# Patient Record
Sex: Female | Born: 1980 | Race: White | Hispanic: No | Marital: Married | Smoking: Former smoker
Health system: Southern US, Community
[De-identification: ages and names within clinical notes are randomized; demographics above are authoritative.]

## PROBLEM LIST (undated history)

## (undated) DIAGNOSIS — IMO0002 Reserved for concepts with insufficient information to code with codable children: Secondary | ICD-10-CM

## (undated) HISTORY — PX: WISDOM TOOTH EXTRACTION: SHX21

## (undated) HISTORY — DX: Reserved for concepts with insufficient information to code with codable children: IMO0002

## (undated) HISTORY — PX: TONSILLECTOMY: SUR1361

---

## 2001-10-03 ENCOUNTER — Encounter: Admission: RE | Admit: 2001-10-03 | Discharge: 2001-10-03 | Payer: Self-pay | Admitting: Family Medicine

## 2001-10-03 ENCOUNTER — Encounter: Payer: Self-pay | Admitting: Family Medicine

## 2004-01-13 HISTORY — PX: LEEP: SHX91

## 2004-02-20 ENCOUNTER — Ambulatory Visit (HOSPITAL_COMMUNITY): Admission: RE | Admit: 2004-02-20 | Discharge: 2004-02-20 | Payer: Self-pay | Admitting: Obstetrics and Gynecology

## 2004-02-20 ENCOUNTER — Encounter (INDEPENDENT_AMBULATORY_CARE_PROVIDER_SITE_OTHER): Payer: Self-pay | Admitting: *Deleted

## 2004-09-29 ENCOUNTER — Encounter: Admission: RE | Admit: 2004-09-29 | Discharge: 2004-09-29 | Payer: Self-pay | Admitting: Family Medicine

## 2004-10-31 ENCOUNTER — Encounter: Admission: RE | Admit: 2004-10-31 | Discharge: 2004-10-31 | Payer: Self-pay | Admitting: Family Medicine

## 2005-03-16 ENCOUNTER — Inpatient Hospital Stay (HOSPITAL_COMMUNITY): Admission: AD | Admit: 2005-03-16 | Discharge: 2005-03-17 | Payer: Self-pay | Admitting: *Deleted

## 2005-04-01 ENCOUNTER — Other Ambulatory Visit: Admission: RE | Admit: 2005-04-01 | Discharge: 2005-04-01 | Payer: Self-pay | Admitting: Obstetrics and Gynecology

## 2005-08-21 ENCOUNTER — Ambulatory Visit (HOSPITAL_COMMUNITY): Admission: AD | Admit: 2005-08-21 | Discharge: 2005-08-21 | Payer: Self-pay | Admitting: Obstetrics and Gynecology

## 2005-09-12 ENCOUNTER — Inpatient Hospital Stay (HOSPITAL_COMMUNITY): Admission: AD | Admit: 2005-09-12 | Discharge: 2005-09-12 | Payer: Self-pay | Admitting: Obstetrics & Gynecology

## 2005-11-08 ENCOUNTER — Inpatient Hospital Stay (HOSPITAL_COMMUNITY): Admission: AD | Admit: 2005-11-08 | Discharge: 2005-11-11 | Payer: Self-pay | Admitting: Obstetrics and Gynecology

## 2008-02-15 ENCOUNTER — Emergency Department (HOSPITAL_BASED_OUTPATIENT_CLINIC_OR_DEPARTMENT_OTHER): Admission: EM | Admit: 2008-02-15 | Discharge: 2008-02-16 | Payer: Self-pay | Admitting: Emergency Medicine

## 2008-02-15 ENCOUNTER — Ambulatory Visit: Payer: Self-pay | Admitting: Diagnostic Radiology

## 2010-01-12 NOTE — L&D Delivery Note (Signed)
Delivery Note At 8:39 AM a viable and healthy female was delivered vaginally, vacuum assist (bradycardia noted at +3 station).  Presentation: Left Occiput Anterior.  APGAR: 9, 9; weight 8 lb 5 oz (3771 g).   Placenta status: Intact, Spontaneous.  Cord blood donation.  Cord: 3 vessels.  Anesthesia: Epidural  Episiotomy: None Lacerations: 2nd degree Suture Repair: 3.0 chromic Est. Blood Loss (mL): 450  Mom to postpartum.  Baby to nursery-stable.  Javen Ridings D 12/29/2010, 9:32 AM

## 2010-05-13 LAB — GC/CHLAMYDIA PROBE AMP, GENITAL
Chlamydia: NEGATIVE
Gonorrhea: NEGATIVE

## 2010-05-13 LAB — HIV ANTIBODY (ROUTINE TESTING W REFLEX): HIV: NONREACTIVE

## 2010-05-13 LAB — ABO/RH: RH Type: NEGATIVE

## 2010-05-13 LAB — RUBELLA ANTIBODY, IGM: Rubella: IMMUNE

## 2010-05-13 LAB — ANTIBODY SCREEN: Antibody Screen: NEGATIVE

## 2010-05-13 LAB — RPR: RPR: NONREACTIVE

## 2010-05-30 NOTE — Op Note (Signed)
NAME:  Paige Nielsen, Paige Nielsen             ACCOUNT NO.:  [id number]   MEDICAL RECORD NO.:  [id number]          PATIENT TYPE:  AMB   LOCATION:  SDC                           FACILITY:  [facility]   PHYSICIAN:  Maxie Better, M.D.DATE OF BIRTH:  November 04, 1980   DATE OF PROCEDURE:  02/20/2004  DATE OF DISCHARGE:                                 OPERATIVE REPORT   PREOPERATIVE DIAGNOSIS:  Severe vulvar dysplasia, moderate cervical  dysplasia.   POSTOPERATIVE DIAGNOSIS:  VIN-III and CIN-II.   OPERATION PERFORMED:  CO2 laser vaporization of the cervix and vulvar  dysplasia, LEEP procedure of the cervix.   SURGEON:  Maxie Better, M.D.   ANESTHESIA:  General.   DESCRIPTION OF PROCEDURE:  Under adequate general anesthesia, the patient  was placed in dorsal lithotomy position.  Acetic acid 4% soaked gauze was  placed in the posterior fourchette area.  A bivalve speculum was placed in  the vagina.  The surrounding vulvar area was prepped with soaked wet towels.  4% acetic acid was placed on the cervix.  Colposcopy was performed again  revealing the acetowhite epithelium predominantly in the anterior lip and  some coarse mosaic patterns at 2 o'clock and an isolated Michaelfurt of  acetowhite tissue on the posterior lip as well.  The cervix was infiltrated  with a 0.5% Marcaine with epinephrine.  The outlined area of dysplasia was  identified using the laser continuous wave spot check around  circumferentially.  A 10 mm loupe was then utilized to perform a LEEP cone  taking the anterior posterior lip small portion and then the remaining  portion of the cervix containing the cervical dysplasia was then laser  vaporized in continuous waves using between 10 and 25 watts to a depth of 3  mm externally and about 6 internally.  The  outside area was then fan  brushed with 5 watts.  The base of the coned area and lasered area was then  hemostased with Monsel solution.  The bivalve speculum was then removed  and  the gauze removed from the area of the fourchette showing a large about a  quarter-sized area of whitened tissue from the 4% acetic acid on the right  lower aspect of the posterior fourchette.  That area was demarcated using a  marking pen and 25 watts were then used to carefully vaporize the vulvar  dysplastic areas.  The area was then infiltrated with local anesthesia as  well.  The procedure then was terminated.   SPECIMEN:  The LEEP anterior posterior lip.   ESTIMATED BLOOD LOSS:  Minimal.   COMPLICATIONS:  None.   The patient had Silvadene cream placed on the vulvar area prior to leaving  the operating room.  She was then transferred to the recovery room in stable  condition.      [place]/MEDQ  D:  02/20/2004  T:  02/20/2004  Job:  [id number]

## 2010-07-18 ENCOUNTER — Other Ambulatory Visit (HOSPITAL_COMMUNITY): Payer: Self-pay | Admitting: Obstetrics and Gynecology

## 2010-07-18 DIAGNOSIS — Z3689 Encounter for other specified antenatal screening: Secondary | ICD-10-CM

## 2010-07-24 ENCOUNTER — Ambulatory Visit (HOSPITAL_COMMUNITY)
Admission: RE | Admit: 2010-07-24 | Discharge: 2010-07-24 | Disposition: A | Discharge status: Home / Self Care | Payer: 59 | Source: Ambulatory Visit | Attending: Obstetrics and Gynecology | Admitting: Obstetrics and Gynecology

## 2010-07-24 DIAGNOSIS — Z3689 Encounter for other specified antenatal screening: Secondary | ICD-10-CM

## 2010-07-24 DIAGNOSIS — Z363 Encounter for antenatal screening for malformations: Secondary | ICD-10-CM | POA: Insufficient documentation

## 2010-07-24 DIAGNOSIS — Z1389 Encounter for screening for other disorder: Secondary | ICD-10-CM | POA: Insufficient documentation

## 2010-07-24 DIAGNOSIS — O358XX0 Maternal care for other (suspected) fetal abnormality and damage, not applicable or unspecified: Secondary | ICD-10-CM | POA: Insufficient documentation

## 2010-11-14 LAB — STREP B DNA PROBE: GBS: NEGATIVE

## 2010-12-24 ENCOUNTER — Encounter (HOSPITAL_COMMUNITY): Payer: Self-pay | Admitting: *Deleted

## 2010-12-24 ENCOUNTER — Telehealth (HOSPITAL_COMMUNITY): Payer: Self-pay | Admitting: *Deleted

## 2010-12-24 NOTE — Telephone Encounter (Signed)
Preadmission screen  

## 2010-12-28 ENCOUNTER — Inpatient Hospital Stay (HOSPITAL_COMMUNITY)
Admission: AD | Admit: 2010-12-28 | Discharge: 2010-12-31 | DRG: 775 | Disposition: A | Discharge status: Home / Self Care | Payer: 59 | Source: Ambulatory Visit | Attending: Obstetrics and Gynecology | Admitting: Obstetrics and Gynecology

## 2010-12-28 ENCOUNTER — Inpatient Hospital Stay (HOSPITAL_COMMUNITY): Payer: 59

## 2010-12-28 ENCOUNTER — Encounter (HOSPITAL_COMMUNITY): Payer: Self-pay

## 2010-12-28 DIAGNOSIS — O368190 Decreased fetal movements, unspecified trimester, not applicable or unspecified: Secondary | ICD-10-CM | POA: Diagnosis present

## 2010-12-28 DIAGNOSIS — Z3480 Encounter for supervision of other normal pregnancy, unspecified trimester: Secondary | ICD-10-CM

## 2010-12-28 LAB — CBC
Platelets: 205 10*3/uL (ref 150–400)
RBC: 3.81 MIL/uL — ABNORMAL LOW (ref 3.87–5.11)
WBC: 10 10*3/uL (ref 4.0–10.5)

## 2010-12-28 MED ORDER — FLEET ENEMA 7-19 GM/118ML RE ENEM: 1.0000 | ENEMA | RECTAL | Status: DC | PRN

## 2010-12-28 MED ORDER — ACETAMINOPHEN 325 MG PO TABS: 650.0000 mg | ORAL_TABLET | ORAL | Status: DC | PRN

## 2010-12-28 MED ORDER — LIDOCAINE HCL (PF) 1 % IJ SOLN
30.0000 mL | INTRAMUSCULAR | Status: DC | PRN
  Filled 2010-12-28: qty 30

## 2010-12-28 MED ORDER — LACTATED RINGERS IV SOLN
INTRAVENOUS | Status: DC
  Administered 2010-12-29 (×2): via INTRAVENOUS

## 2010-12-28 MED ORDER — OXYTOCIN BOLUS FROM INFUSION
500.0000 mL | Freq: Once | INTRAVENOUS | Status: DC
  Filled 2010-12-28: qty 500

## 2010-12-28 MED ORDER — OXYTOCIN 20 UNITS IN LACTATED RINGERS INFUSION - SIMPLE
125.0000 mL/h | Freq: Once | INTRAVENOUS | Status: AC
  Administered 2010-12-29: 125 mL/h via INTRAVENOUS

## 2010-12-28 MED ORDER — IBUPROFEN 600 MG PO TABS
600.0000 mg | ORAL_TABLET | Freq: Four times a day (QID) | ORAL | Status: DC | PRN
  Administered 2010-12-29: 600 mg via ORAL
  Filled 2010-12-28: qty 1

## 2010-12-28 MED ORDER — LACTATED RINGERS IV SOLN: 500.0000 mL | INTRAVENOUS | Status: DC | PRN

## 2010-12-28 MED ORDER — ONDANSETRON HCL 4 MG/2ML IJ SOLN
4.0000 mg | Freq: Four times a day (QID) | INTRAMUSCULAR | Status: DC | PRN
  Administered 2010-12-29: 4 mg via INTRAVENOUS
  Filled 2010-12-28: qty 2

## 2010-12-28 MED ORDER — OXYCODONE-ACETAMINOPHEN 5-325 MG PO TABS
2.0000 | ORAL_TABLET | ORAL | Status: DC | PRN
  Administered 2010-12-29: 2 via ORAL
  Filled 2010-12-28: qty 2

## 2010-12-28 MED ORDER — CITRIC ACID-SODIUM CITRATE 334-500 MG/5ML PO SOLN: 30.0000 mL | ORAL | Status: DC | PRN

## 2010-12-28 NOTE — H&P (Signed)
30 y.o. @41 .2  G2P1001 comes in c/o decreased FM and mucous discharge. Pt denied bleeding, HA, vision change, RUQ pain.   Past Medical History  Diagnosis Date  . Abnormal Pap smear     Past Surgical History  Procedure Date  . Leep 2006  . Tonsillectomy   . Wisdom tooth extraction     OB History    Grav Para Term Preterm Abortions TAB SAB Ect Mult Living   2 1 1       1      # Outc Date GA Lbr Len/2nd Wgt Sex Del Anes PTL Lv   1 TRM 2007 [redacted]w[redacted]d  7lb9oz(3.43kg) F SVD EPI  Yes   2 CUR               History   Social History  . Marital Status: Married    Spouse Name: N/A    Number of Children: N/A  . Years of Education: N/A   Occupational History  . Not on file.   Social History Main Topics  . Smoking status: Former Smoker    Quit date: 01/23/2010  . Smokeless tobacco: Never Used  . Alcohol Use: No  . Drug Use: No  . Sexually Active: Yes   Other Topics Concern  . Not on file   Social History Narrative  . No narrative on file   Sulfa antibiotics   Prenatal Course:  Hx of LEEP, former smoker, morbid obesity  Filed Vitals:   12/28/10 2205  BP: 126/79  Pulse: 96  Temp:   Resp: 18     Lungs/Cor:  NAD Abdomen:  soft, gravid Ex:  no cords, erythema SVE:  5/90/-2 FHTs:  130, good STV, NST R Toco:  q2 [place]: AFI ~8   A/P   41.2 with scheduled IOL tomorrow admitted labor - possibly late latent phase Epidural if desired  GBS Neg Although BPs nl, with one mild range, they were significantly higher than those documented throughout the pregnancy- continue to monitor. Continue other routine care. No pitocin currently as pt contracting too frequently, but not strong.  Will await patient moving into active phase.  Philip Aspen

## 2010-12-28 NOTE — Progress Notes (Signed)
Dr. Claiborne Billings informed of AFI 8.22,

## 2010-12-28 NOTE — Progress Notes (Signed)
Pt c/o pink mucous and vag pressure today.  No leaking of fluid

## 2010-12-28 NOTE — Progress Notes (Signed)
Onset of contractions, lower abdominal cramping, mucus bloody, every 5 minutes 41 weeks G2P1

## 2010-12-29 ENCOUNTER — Encounter (HOSPITAL_COMMUNITY): Payer: Self-pay | Admitting: Anesthesiology

## 2010-12-29 ENCOUNTER — Inpatient Hospital Stay (HOSPITAL_COMMUNITY): Payer: 59 | Admitting: Anesthesiology

## 2010-12-29 ENCOUNTER — Inpatient Hospital Stay (HOSPITAL_COMMUNITY): Admission: RE | Admit: 2010-12-29 | Payer: 59 | Source: Ambulatory Visit

## 2010-12-29 ENCOUNTER — Encounter (HOSPITAL_COMMUNITY): Payer: Self-pay | Admitting: *Deleted

## 2010-12-29 LAB — RPR: RPR Ser Ql: NONREACTIVE

## 2010-12-29 MED ORDER — ONDANSETRON HCL 4 MG/2ML IJ SOLN: 4.0000 mg | INTRAMUSCULAR | Status: DC | PRN

## 2010-12-29 MED ORDER — DIPHENHYDRAMINE HCL 25 MG PO CAPS: 25.0000 mg | ORAL_CAPSULE | Freq: Four times a day (QID) | ORAL | Status: DC | PRN

## 2010-12-29 MED ORDER — DIPHENHYDRAMINE HCL 50 MG/ML IJ SOLN: 12.5000 mg | INTRAMUSCULAR | Status: DC | PRN

## 2010-12-29 MED ORDER — PRENATAL PLUS 27-1 MG PO TABS
1.0000 | ORAL_TABLET | Freq: Every day | ORAL | Status: DC
  Administered 2010-12-29 – 2010-12-31 (×3): 1 via ORAL
  Filled 2010-12-29 (×3): qty 1

## 2010-12-29 MED ORDER — PHENYLEPHRINE 40 MCG/ML (10ML) SYRINGE FOR IV PUSH (FOR BLOOD PRESSURE SUPPORT): 80.0000 ug | PREFILLED_SYRINGE | INTRAVENOUS | Status: DC | PRN

## 2010-12-29 MED ORDER — TETANUS-DIPHTH-ACELL PERTUSSIS 5-2.5-18.5 LF-MCG/0.5 IM SUSP
0.5000 mL | Freq: Once | INTRAMUSCULAR | Status: AC
  Administered 2010-12-30: 0.5 mL via INTRAMUSCULAR
  Filled 2010-12-29: qty 0.5

## 2010-12-29 MED ORDER — WITCH HAZEL-GLYCERIN EX PADS: 1.0000 "application " | MEDICATED_PAD | CUTANEOUS | Status: DC | PRN

## 2010-12-29 MED ORDER — EPHEDRINE 5 MG/ML INJ
10.0000 mg | INTRAVENOUS | Status: DC | PRN
  Filled 2010-12-29: qty 4

## 2010-12-29 MED ORDER — LACTATED RINGERS IV SOLN: 500.0000 mL | Freq: Once | INTRAVENOUS | Status: DC

## 2010-12-29 MED ORDER — OXYTOCIN 20 UNITS IN LACTATED RINGERS INFUSION - SIMPLE
1.0000 m[IU]/min | INTRAVENOUS | Status: DC
  Administered 2010-12-29: 2 m[IU]/min via INTRAVENOUS
  Filled 2010-12-29: qty 1000

## 2010-12-29 MED ORDER — OXYCODONE-ACETAMINOPHEN 5-325 MG PO TABS
1.0000 | ORAL_TABLET | ORAL | Status: DC | PRN
  Administered 2010-12-29 – 2010-12-31 (×8): 2 via ORAL
  Administered 2010-12-31: 1 via ORAL
  Filled 2010-12-29 (×8): qty 2
  Filled 2010-12-29: qty 1

## 2010-12-29 MED ORDER — FENTANYL 2.5 MCG/ML BUPIVACAINE 1/10 % EPIDURAL INFUSION (WH - ANES)
INTRAMUSCULAR | Status: DC | PRN
  Administered 2010-12-29: 14 mL/h via EPIDURAL

## 2010-12-29 MED ORDER — TERBUTALINE SULFATE 1 MG/ML IJ SOLN: 0.2500 mg | Freq: Once | INTRAMUSCULAR | Status: DC | PRN

## 2010-12-29 MED ORDER — BENZOCAINE-MENTHOL 20-0.5 % EX AERO: 1.0000 "application " | INHALATION_SPRAY | CUTANEOUS | Status: DC | PRN

## 2010-12-29 MED ORDER — IBUPROFEN 600 MG PO TABS
600.0000 mg | ORAL_TABLET | Freq: Four times a day (QID) | ORAL | Status: DC
  Administered 2010-12-29 – 2010-12-31 (×8): 600 mg via ORAL
  Filled 2010-12-29 (×8): qty 1

## 2010-12-29 MED ORDER — SENNOSIDES-DOCUSATE SODIUM 8.6-50 MG PO TABS
2.0000 | ORAL_TABLET | Freq: Every day | ORAL | Status: DC
  Administered 2010-12-29 – 2010-12-30 (×2): 2 via ORAL

## 2010-12-29 MED ORDER — SIMETHICONE 80 MG PO CHEW: 80.0000 mg | CHEWABLE_TABLET | ORAL | Status: DC | PRN

## 2010-12-29 MED ORDER — ZOLPIDEM TARTRATE 5 MG PO TABS: 5.0000 mg | ORAL_TABLET | Freq: Every evening | ORAL | Status: DC | PRN

## 2010-12-29 MED ORDER — LIDOCAINE HCL 1.5 % IJ SOLN
INTRAMUSCULAR | Status: DC | PRN
  Administered 2010-12-29 (×2): 5 mL via EPIDURAL

## 2010-12-29 MED ORDER — EPHEDRINE 5 MG/ML INJ: 10.0000 mg | INTRAVENOUS | Status: DC | PRN

## 2010-12-29 MED ORDER — FENTANYL 2.5 MCG/ML BUPIVACAINE 1/10 % EPIDURAL INFUSION (WH - ANES)
14.0000 mL/h | INTRAMUSCULAR | Status: DC
Start: 2010-12-29 — End: 2010-12-29
  Administered 2010-12-29 (×2): 14 mL/h via EPIDURAL
  Filled 2010-12-29 (×3): qty 60

## 2010-12-29 MED ORDER — BENZOCAINE-MENTHOL 20-0.5 % EX AERO
INHALATION_SPRAY | CUTANEOUS | Status: AC
  Filled 2010-12-29: qty 56

## 2010-12-29 MED ORDER — DIBUCAINE 1 % RE OINT: 1.0000 "application " | TOPICAL_OINTMENT | RECTAL | Status: DC | PRN

## 2010-12-29 MED ORDER — LANOLIN HYDROUS EX OINT: TOPICAL_OINTMENT | CUTANEOUS | Status: DC | PRN

## 2010-12-29 MED ORDER — ONDANSETRON HCL 4 MG PO TABS: 4.0000 mg | ORAL_TABLET | ORAL | Status: DC | PRN

## 2010-12-29 MED ORDER — PHENYLEPHRINE 40 MCG/ML (10ML) SYRINGE FOR IV PUSH (FOR BLOOD PRESSURE SUPPORT)
80.0000 ug | PREFILLED_SYRINGE | INTRAVENOUS | Status: DC | PRN
  Filled 2010-12-29: qty 5

## 2010-12-29 NOTE — Anesthesia Procedure Notes (Addendum)
Epidural Patient location during procedure: OB Start time: 12/29/2010 12:57 AM End time: 12/29/2010 1:03 AM Reason for block: procedure for pain  Staffing Anesthesiologist: Sandrea Hughs Performed by: anesthesiologist   Preanesthetic Checklist Completed: patient identified, site marked, surgical consent, pre-op evaluation, timeout performed, IV checked, risks and benefits discussed and monitors and equipment checked  Epidural Patient position: sitting Prep: site prepped and draped and DuraPrep Patient monitoring: continuous pulse ox and blood pressure Approach: midline Injection technique: LOR air  Needle:  Needle type: Tuohy  Needle gauge: 17 G Needle length: 9 cm Needle insertion depth: 7 cm Catheter type: closed end flexible Catheter size: 19 Gauge Catheter at skin depth: 13 cm Test dose: negative and 1.5% lidocaine  Assessment Sensory level: T8 Events: blood not aspirated, injection not painful, no injection resistance, negative IV test and no paresthesia

## 2010-12-29 NOTE — Progress Notes (Signed)
Delivery of Live viable female at 8:39. Apgars 9,9

## 2010-12-29 NOTE — Anesthesia Preprocedure Evaluation (Signed)
Anesthesia Evaluation  Patient identified by MRN, date of birth, ID band Patient awake    Reviewed: Allergy & Precautions, H&P , NPO status , Patient's Chart, lab work & pertinent test results  Airway Mallampati: II TM Distance: >3 FB Neck ROM: full    Dental No notable dental hx.    Pulmonary neg pulmonary ROS,    Pulmonary exam normal       Cardiovascular neg cardio ROS     Neuro/Psych Negative Neurological ROS  Negative Psych ROS   GI/Hepatic negative GI ROS, Neg liver ROS,   Endo/Other  Morbid obesity  Renal/GU negative Renal ROS  Genitourinary negative   Musculoskeletal negative musculoskeletal ROS (+)   Abdominal (+) obese,   Peds negative pediatric ROS (+)  Hematology negative hematology ROS (+)   Anesthesia Other Findings   Reproductive/Obstetrics (+) Pregnancy                           Anesthesia Physical Anesthesia Plan  ASA: III  Anesthesia Plan: Epidural   Post-op Pain Management:    Induction:   Airway Management Planned:   Additional Equipment:   Intra-op Plan:   Post-operative Plan:   Informed Consent: I have reviewed the patients History and Physical, chart, labs and discussed the procedure including the risks, benefits and alternatives for the proposed anesthesia with the patient or authorized representative who has indicated his/her understanding and acceptance.     Plan Discussed with:   Anesthesia Plan Comments:         Anesthesia Quick Evaluation  

## 2010-12-29 NOTE — Progress Notes (Signed)
Pt comfortable s/p epidural. FHT 150, mod var, +A, some variable decels TOCO q2 SVE 7/90/0 AROM clear Cont. Pit Routine care.

## 2010-12-30 LAB — CBC
Hemoglobin: 10.3 g/dL — ABNORMAL LOW (ref 12.0–15.0)
Platelets: 177 10*3/uL (ref 150–400)
RBC: 3.45 MIL/uL — ABNORMAL LOW (ref 3.87–5.11)

## 2010-12-30 MED ORDER — RHO D IMMUNE GLOBULIN 1500 UNIT/2ML IJ SOLN
300.0000 ug | Freq: Once | INTRAMUSCULAR | Status: AC
  Administered 2010-12-30: 300 ug via INTRAMUSCULAR
  Filled 2010-12-30: qty 2

## 2010-12-30 NOTE — Progress Notes (Signed)
UR chart review completed.  

## 2010-12-30 NOTE — Progress Notes (Signed)
Patient is eating, ambulating, voiding.  Pain control is good.  Filed Vitals:   12/29/10 1155 12/29/10 1556 12/29/10 2117 12/30/10 0528  BP: 121/65 112/72 89/55 104/73  Pulse: 95 85 88 80  Temp: 97.9 F (36.6 C) 97.1 F (36.2 C) 98.2 F (36.8 C) 97.4 F (36.3 C)  TempSrc: Oral Oral Oral Oral  Resp: 20 18 18 18   Height:      Weight:      SpO2:        Fundus firm Perineum without swelling.  Lab Results  Component Value Date   WBC 9.6 12/30/2010   HGB 10.3* 12/30/2010   HCT 32.1* 12/30/2010   MCV 93.0 12/30/2010   PLT 177 12/30/2010    O/Negative/-- (05/01 0000)/RI  A/P Post partum day 1.  Routine care.  Expect d/c tomorrow.  Baby O pos- to get rhogam.  Parents desires circumsision.  All risks, benefits and alternatives discussed with the mother. Unable to do circ because of no instruments this am. Lurene Robley A

## 2010-12-30 NOTE — Progress Notes (Signed)
circ done in pm 

## 2010-12-30 NOTE — Anesthesia Postprocedure Evaluation (Signed)
  Anesthesia Post Note  Patient: Paige Nielsen  Procedure(s) Performed: * No procedures listed *  Anesthesia type: Epidural  Patient location: Mother/Baby  Post pain: Pain level controlled  Post assessment: Post-op Vital signs reviewed  Last Vitals:  Filed Vitals:   12/30/10 0528  BP: 104/73  Pulse: 80  Temp: 36.3 C  Resp: 18    Post vital signs: Reviewed  Level of consciousness:alert  Complications: No apparent anesthesia complications

## 2010-12-31 LAB — RH IG WORKUP (INCLUDES ABO/RH)
ABO/RH(D): O NEG
Fetal Screen: NEGATIVE
Gestational Age(Wks): 41.3

## 2010-12-31 MED ORDER — IBUPROFEN 600 MG PO TABS: 600.0000 mg | ORAL_TABLET | Freq: Four times a day (QID) | ORAL | Status: AC

## 2010-12-31 MED ORDER — BENZOCAINE-MENTHOL 20-0.5 % EX AERO
INHALATION_SPRAY | CUTANEOUS | Status: AC
  Filled 2010-12-31: qty 56

## 2010-12-31 MED ORDER — OXYCODONE-ACETAMINOPHEN 5-325 MG PO TABS: 1.0000 | ORAL_TABLET | ORAL | Status: AC | PRN

## 2010-12-31 NOTE — Discharge Summary (Signed)
Obstetric Discharge Summary Reason for Admission: onset of labor Prenatal Procedures: ultrasound Intrapartum Procedures: vacuum Postpartum Procedures: none Complications-Operative and Postpartum: 2 degree perineal laceration Hemoglobin  Date Value Range Status  12/30/2010 10.3* 12.0-15.0 (g/dL) Final     HCT  Date Value Range Status  12/30/2010 32.1* 36.0-46.0 (%) Final    Discharge Diagnoses: Term Pregnancy-delivered  Discharge Information: Date: 12/31/2010 Activity: pelvic rest Diet: routine Medications: PNV, Ibuprofen and Percocet Condition: stable Instructions: refer to practice specific booklet Discharge to: home Follow-up Information    Make an appointment in 4 weeks to follow up.         Newborn Data: Live born female  Birth Weight: 8 lb 5 oz (3771 g) APGAR: 9, 9  Home with mother.  ANDERSON,MARK E 12/31/2010, 7:03 AM

## 2010-12-31 NOTE — Progress Notes (Signed)
PPD#2 Pt doing well. VSSAF Lochia-mild IMP/ doing well Plan/ discharge

## 2013-06-25 IMAGING — US US OB DETAIL+14 WK
2 series · 12 of 28 positions shown · non-contrast
Comparison: none

[Series 1: us ob detail +14 wk · 10 of 55 slices shown (1 of 2)]
[im 3/55]
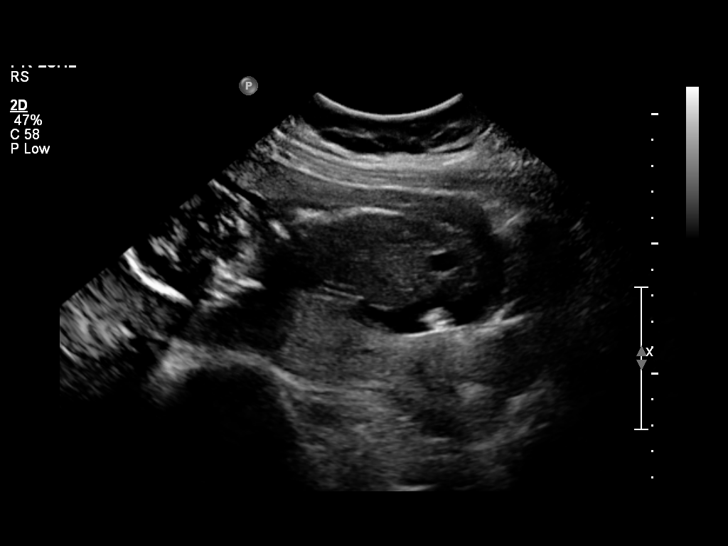
[im 8/55]
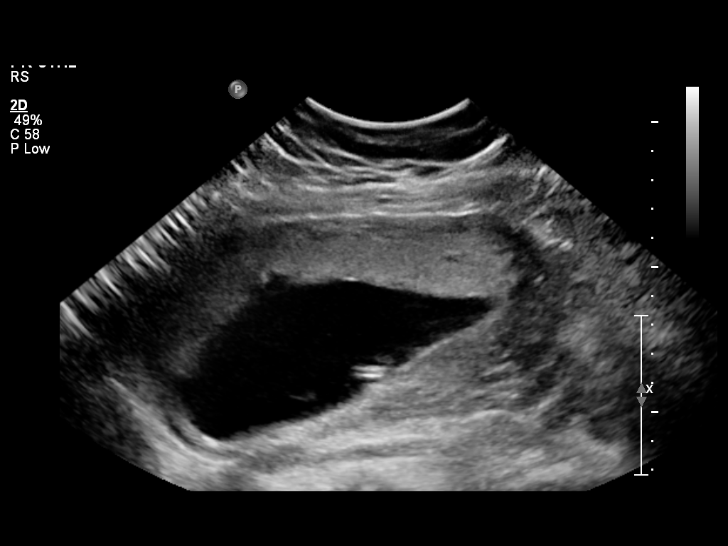
[im 13/55]
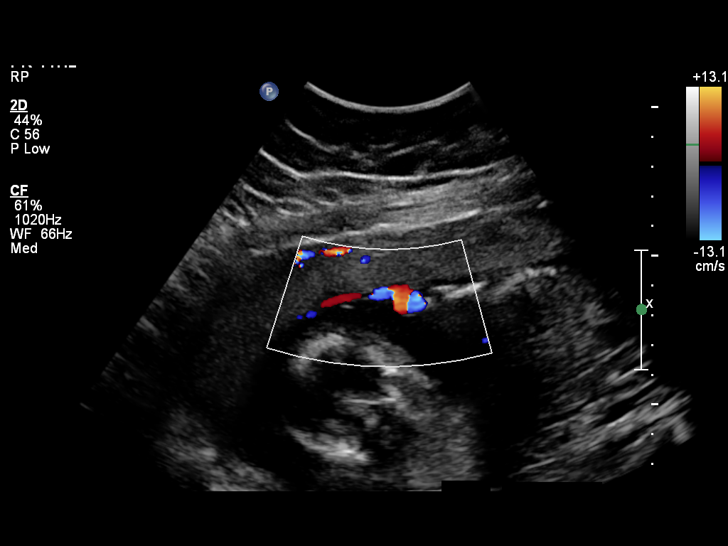
[im 20/55]
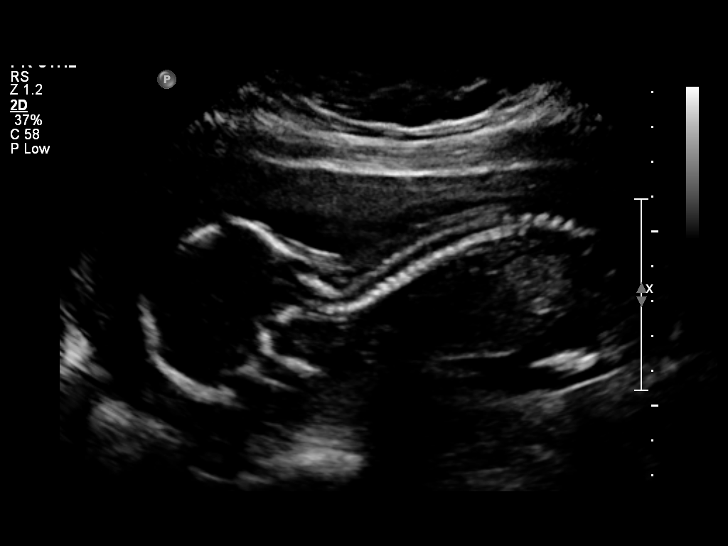
[im 25/55]
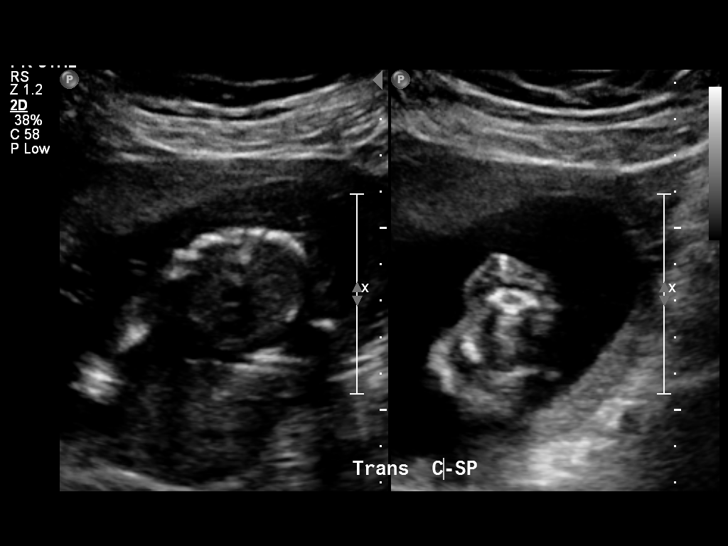
[im 30/55]
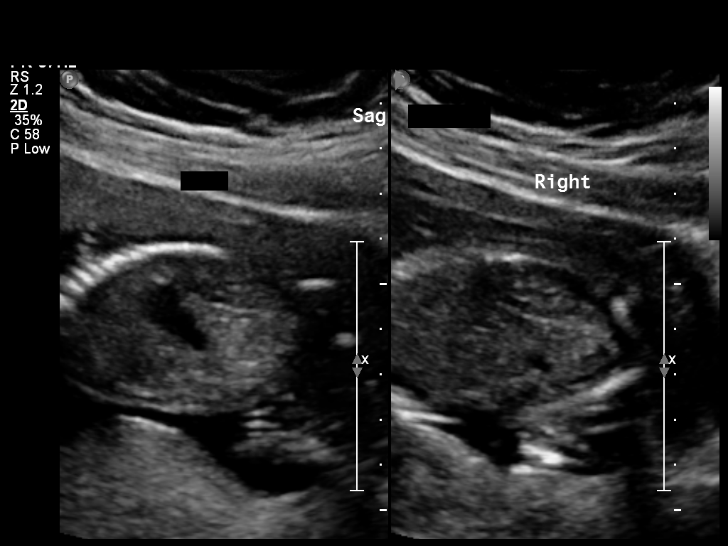
[im 37/55]
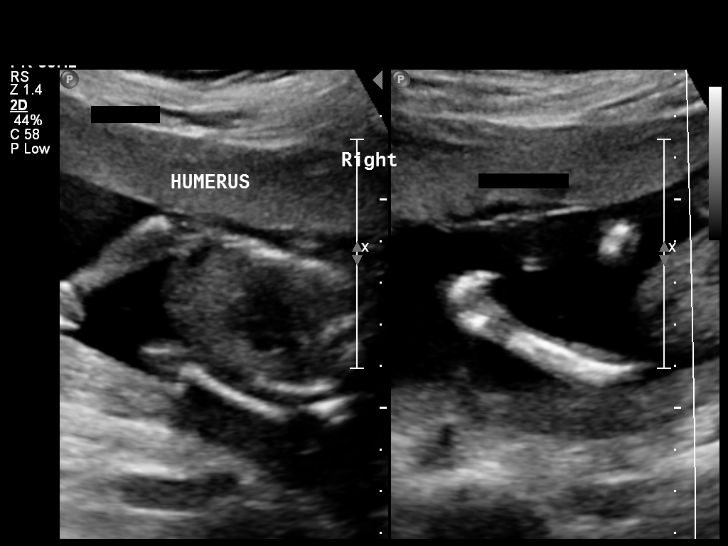
[im 42/55]
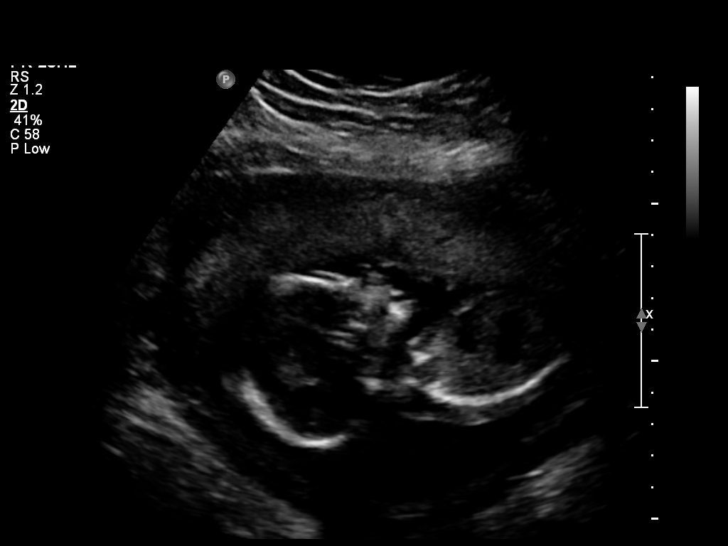
[im 47/55]
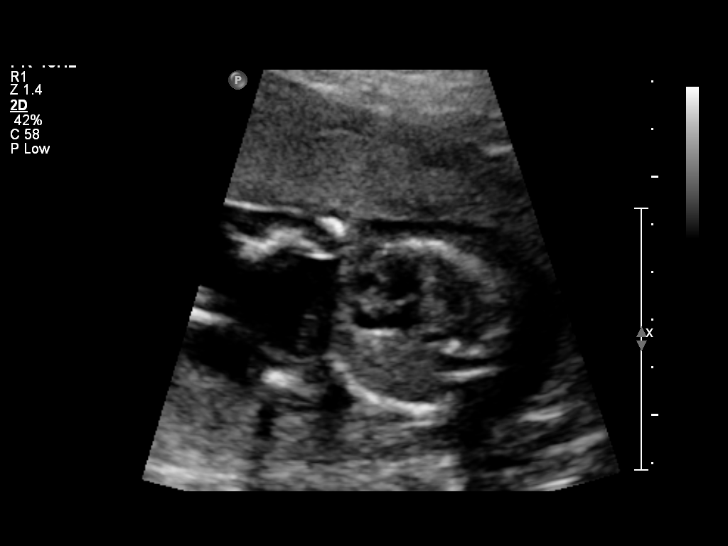
[im 55/55]
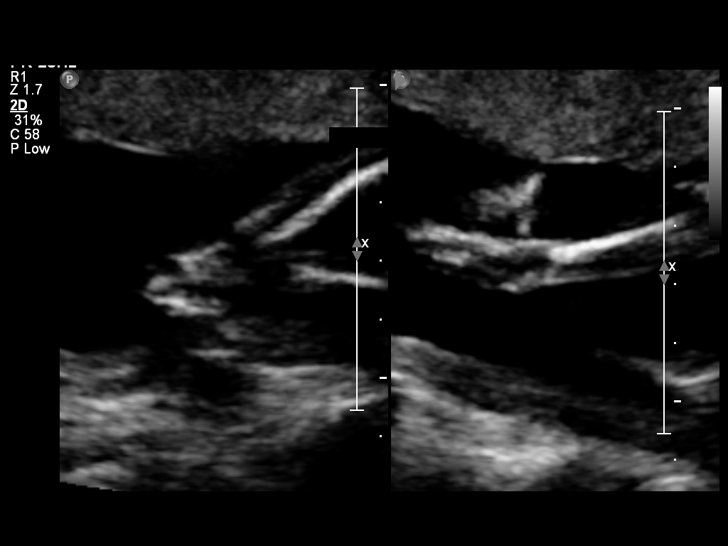

[Series 1: us ob detail +14 wk · 2 of 11 slices shown (2 of 2)]
[im 3/11]
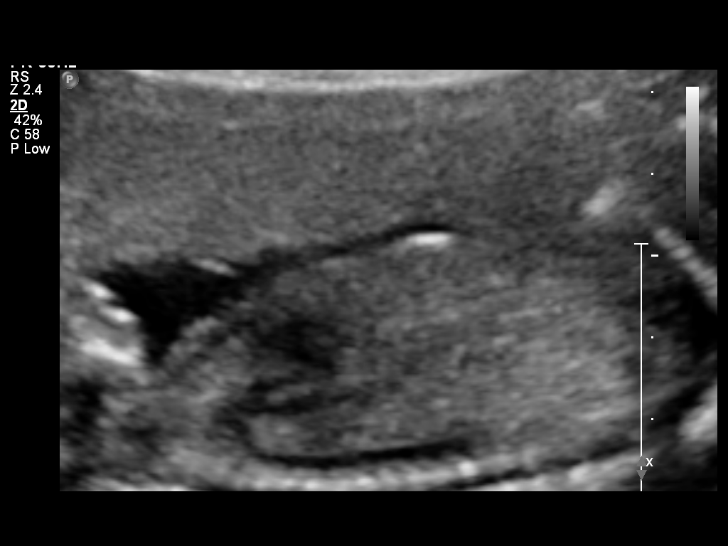
[im 8/11]
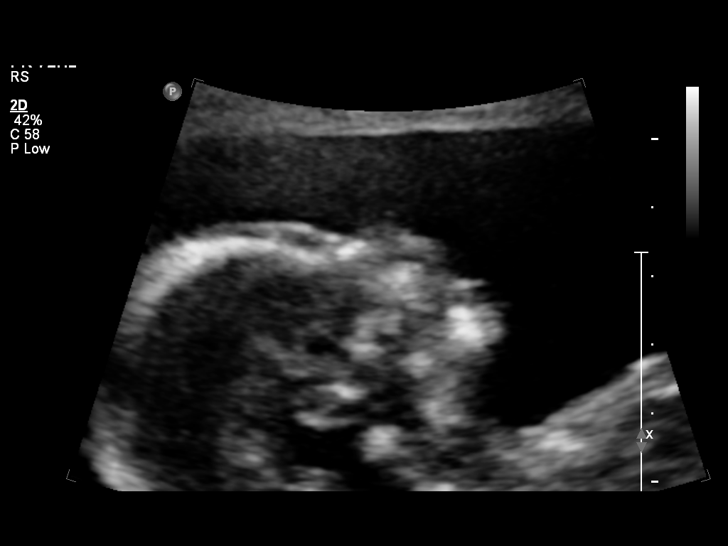

[12 of 28 positions shown; findings below may reference images not displayed]

OBSTETRICS REPORT
                      (Signed Final 07/24/2010 [DATE])

 Order#:         [id number]
Procedures

 US OB DETAIL + 14 WK                                  76811.0
Indications

 Detailed fetal anatomic survey
Fetal Evaluation

 Fetal Heart Rate:  147                          bpm
 Cardiac Activity:  Observed
 Presentation:      Breech
 Placenta:          Anterior, above cervical os
 P. Cord            Visualized
 Insertion:

 Amniotic Fluid
 AFI FV:      Subjectively within normal limits
                                             Larg Pckt:     4.5  cm
Biometry

 BPD:     42.9  mm     G. Age:  19w 0d                CI:        69.76   70 - 86
                                                      FL/HC:      17.9   16.1 -

 HC:     163.9  mm     G. Age:  19w 1d       56  %    HC/AC:      1.21   1.09 -

 AC:     135.4  mm     G. Age:  19w 0d       51  %    FL/BPD:
 FL:      29.3  mm     G. Age:  19w 0d       49  %    FL/AC:      21.6   20 - 24
 HUM:     28.5  mm     G. Age:  19w 1d       60  %
 NFT:     2.27  mm

 Est. FW:     270  gm    0 lb 10 oz      48  %
Gestational Age

 Clinical EDD:  18w 6d                                        EDD:   12/19/10
 U/S Today:     19w 0d                                        EDD:   12/18/10
 Best:          18w 6d     Det. By:  Clinical EDD             EDD:   12/19/10
Anatomy
 Cranium:           Appears normal      Aortic Arch:       Appears normal
 Fetal Cavum:       Appears normal      Ductal Arch:       Appears normal
 Ventricles:        Appears normal      Diaphragm:         Appears normal
 Choroid Plexus:    Appears normal      Stomach:           Appears
                                                           normal, left
                                                           sided
 Cerebellum:        Appears normal      Abdomen:           Appears normal
 Posterior Fossa:   Appears normal      Abdominal Wall:    Appears nml
                                                           (cord insert,
                                                           abd wall)
 Nuchal Fold:       Appears normal      Cord Vessels:      Appears normal
                    (neck, nuchal                          (3 vessel cord)
                    fold)
 Face:              Appears normal      Kidneys:           Appear normal
                    (lips/profile/orbit
                    s)
 Heart:             Appears normal      Bladder:           Appears normal
                    (4 chamber &
                    axis)
 RVOT:              Appears normal      Spine:             Appears normal
 LVOT:              Appears normal      Limbs:             Appears normal
                                                           (hands, ankles,
                                                           feet)

 Other:     Male gender. Heels and 5th digit visualized. Technically
            difficult due to  maternal habitus.
Cervix Uterus Adnexa

 Cervical Length:    3.35     cm

 Cervix:       Closed.

 Adnexa:     No abnormality visualized.
Impression

 Siup demonstrating an EGA by ultrasound of 19w 0d. This
 corresponds well with expected EGA by clinical EDD of 18w
 6d.

 No focal fetal or placental abnormalities are noted with overall
 scan resolution compromised by maternal habitus. No soft
 markers for Down Syndrome are seen. Given the expected
 age at delivery of 30, today's normal ultrasound would
 decrease the age related risk for Down Syndrome from [DATE]
 to [DATE] (Reese et al). Correlation with other aneuploidy
 screening results, if available, would be recommended for a
 more complete risk assessment.

 Subjectively and quantitatively normal amniotic fluid volume.
 Normal cervical length.

## 2013-09-05 ENCOUNTER — Other Ambulatory Visit: Payer: Self-pay

## 2013-09-06 LAB — CYTOLOGY - PAP

## 2013-11-13 ENCOUNTER — Encounter (HOSPITAL_COMMUNITY): Payer: Self-pay | Admitting: *Deleted

## 2018-10-20 ENCOUNTER — Other Ambulatory Visit: Payer: Self-pay

## 2018-10-20 DIAGNOSIS — Z20822 Contact with and (suspected) exposure to covid-19: Secondary | ICD-10-CM

## 2018-10-21 LAB — NOVEL CORONAVIRUS, NAA: SARS-CoV-2, NAA: NOT DETECTED

## 2018-11-08 ENCOUNTER — Other Ambulatory Visit: Payer: Self-pay | Admitting: Registered"

## 2018-11-08 DIAGNOSIS — Z20822 Contact with and (suspected) exposure to covid-19: Secondary | ICD-10-CM

## 2018-11-10 LAB — NOVEL CORONAVIRUS, NAA: SARS-CoV-2, NAA: NOT DETECTED

## 2019-09-11 ENCOUNTER — Telehealth: Payer: Self-pay | Admitting: Emergency Medicine

## 2019-09-11 DIAGNOSIS — K140 Glossitis: Secondary | ICD-10-CM

## 2019-09-11 MED ORDER — NYSTATIN 100000 UNIT/ML MT SUSP: 5.0000 mL | Freq: Four times a day (QID) | OROMUCOSAL | 1 refills | Status: DC

## 2019-09-11 NOTE — Progress Notes (Signed)
E-Visit for Mouth Ulcers  We are sorry that you are not feeling well.  Here is how we plan to help!  Based on what you have shared with me, it appears that you do have mouth ulcer(s).     The following medications should decrease the discomfort and help with healing.  Nystatin Suspension swish for 30 seconds and swallow 4 times a day for 7-14 days.  Mouth ulcers are painful areas in the mouth and gums. These are also known as "canker sores".  They can occur anywhere inside the mouth. While mostly harmless, mouth ulcers can be extremely uncomfortable and may make it difficult to eat, drink, and brush your teeth.  You may have more than 1 ulcer and they can vary and change in size. Mouth ulcers are not contagious and should not be confused with cold sores.  Cold sores appear on the lip or around the outside of the mouth and often begin with a tingling, burning or itching sensation.   While the exact causes are unknown, some common causes and factors that may aggravate mouth ulcers include: . Genetics - Sometimes mouth ulcers run in families . High alcohol intake . Acidic foods such as citrus fruits like pineapple, grapefruit, orange fruits/juices, may aggravate mouth ulcers . Other foods high in acidity or spice such as coffee, chocolate, chips, pretzels, eggs, nuts, cheese . Quitting smoking . Injury caused by biting the tongue or inside of the cheek . Diet lacking in B-12, zinc, folic acid or iron . Female hormone shifts with menstruation . Excessive fatigue, emotional stress or anxiety Prevention: . Talk to your doctor if you are taking meds that are known to cause mouth ulcers such as:   Anti-inflammatory drugs (for example Ibuprofen, Naproxen sodium), pain killers, Beta blockers, Oral nicotine replacement drugs, Some street drugs (heroin).   . Avoid allowing any tablets to dissolve in your mouth that are meant to swallowed whole . Avoid foods/drinks that trigger or worsen  symptoms . Keep your mouth clean with daily brushing and flossing  Home Care: . The goal with treatment is to ease the pain where ulcers occur and help them heal as quickly as possible.  There is no medical treatment to prevent mouth ulcers from coming back or recurring.  . Avoid spicy and acidic foods . Eat soft foods and avoid rough, crunchy foods . Avoid chewing gum . Do not use toothpaste that contains sodium lauryl sulphite . Use a straw to drink which helps avoid liquids toughing the ulcers near the front of your mouth . Use a very soft toothbrush . If you have dentures or dental hardware that you feel is not fitting well or contributing to his, please see your dentist. . Use saltwater mouthwash which helps healing. Dissolve a  teaspoon of salt in a glass of warm water. Swish around your mouth and spit it out. This can be used as needed if it is soothing.   GET HELP RIGHT AWAY IF: . Persistent ulcers require checking IN PERSON (face to face). Any mouth lesion lasting longer than a month should be seen by your DENTIST as soon as possible for evaluation for possible oral cancer. . If you have a non-painful ulcer in 1 or more areas of your mouth . Ulcers that are spreading, are very large or particularly painful . Ulcers last longer than one week without improving on treatment . If you develop a fever, swollen glands and begin to feel unwell . Ulcers that developed  after starting a new medication MAKE SURE YOU:  Understand these instructions.  Will watch your condition.  Will get help right away if you are not doing well or get worse.  Your e-visit answers were reviewed by a board certified advanced clinical practitioner to complete your personal care plan.  Depending upon the condition, your plan could have included both over the counter or prescription medications.    Please review your pharmacy choice.  Be sure that the pharmacy you have chosen is open so that you can pick up  your prescription now.  If there is a problem, you can message your provider in MyChart to have the prescription routed to another pharmacy.    Your safety is important to [place].  If you have drug allergies check our prescription carefully.  For the next 24 hours you can use MyChart to ask questions about today's visit, request a non-urgent call back, or ask for a work or school excuse from your e-visit provider.  You will get an email with a survey asking about your experience and to give [place] any feedback.  I hope that your e-visit has been valuable and will speed your recovery.   **Please do not respond to this message unless you have follow up questions.** Greater than 5 but less than 10 minutes spent researching, coordinating, and implementing care for this patient today

## 2020-02-14 DIAGNOSIS — F064 Anxiety disorder due to known physiological condition: Secondary | ICD-10-CM | POA: Diagnosis not present

## 2020-02-20 DIAGNOSIS — Z01419 Encounter for gynecological examination (general) (routine) without abnormal findings: Secondary | ICD-10-CM | POA: Diagnosis not present

## 2020-02-20 DIAGNOSIS — Z124 Encounter for screening for malignant neoplasm of cervix: Secondary | ICD-10-CM | POA: Diagnosis not present

## 2020-02-20 DIAGNOSIS — Z6836 Body mass index (BMI) 36.0-36.9, adult: Secondary | ICD-10-CM | POA: Diagnosis not present

## 2020-02-27 DIAGNOSIS — F064 Anxiety disorder due to known physiological condition: Secondary | ICD-10-CM | POA: Diagnosis not present

## 2020-03-11 DIAGNOSIS — M722 Plantar fascial fibromatosis: Secondary | ICD-10-CM | POA: Diagnosis not present

## 2020-03-12 DIAGNOSIS — F064 Anxiety disorder due to known physiological condition: Secondary | ICD-10-CM | POA: Diagnosis not present

## 2020-03-12 DIAGNOSIS — M722 Plantar fascial fibromatosis: Secondary | ICD-10-CM | POA: Diagnosis not present

## 2020-03-26 DIAGNOSIS — F411 Generalized anxiety disorder: Secondary | ICD-10-CM | POA: Diagnosis not present

## 2020-03-26 DIAGNOSIS — F325 Major depressive disorder, single episode, in full remission: Secondary | ICD-10-CM | POA: Diagnosis not present

## 2020-03-27 DIAGNOSIS — F064 Anxiety disorder due to known physiological condition: Secondary | ICD-10-CM | POA: Diagnosis not present

## 2020-04-08 DIAGNOSIS — Z1231 Encounter for screening mammogram for malignant neoplasm of breast: Secondary | ICD-10-CM | POA: Diagnosis not present

## 2020-04-10 DIAGNOSIS — F064 Anxiety disorder due to known physiological condition: Secondary | ICD-10-CM | POA: Diagnosis not present

## 2020-04-24 DIAGNOSIS — F064 Anxiety disorder due to known physiological condition: Secondary | ICD-10-CM | POA: Diagnosis not present

## 2020-05-08 DIAGNOSIS — F064 Anxiety disorder due to known physiological condition: Secondary | ICD-10-CM | POA: Diagnosis not present

## 2020-05-21 DIAGNOSIS — Z13220 Encounter for screening for lipoid disorders: Secondary | ICD-10-CM | POA: Diagnosis not present

## 2020-05-21 DIAGNOSIS — Z0000 Encounter for general adult medical examination without abnormal findings: Secondary | ICD-10-CM | POA: Diagnosis not present

## 2020-05-28 DIAGNOSIS — F064 Anxiety disorder due to known physiological condition: Secondary | ICD-10-CM | POA: Diagnosis not present

## 2020-06-11 DIAGNOSIS — F064 Anxiety disorder due to known physiological condition: Secondary | ICD-10-CM | POA: Diagnosis not present

## 2020-06-18 DIAGNOSIS — F064 Anxiety disorder due to known physiological condition: Secondary | ICD-10-CM | POA: Diagnosis not present

## 2020-06-20 DIAGNOSIS — F325 Major depressive disorder, single episode, in full remission: Secondary | ICD-10-CM | POA: Diagnosis not present

## 2020-06-20 DIAGNOSIS — F411 Generalized anxiety disorder: Secondary | ICD-10-CM | POA: Diagnosis not present

## 2020-07-02 DIAGNOSIS — F064 Anxiety disorder due to known physiological condition: Secondary | ICD-10-CM | POA: Diagnosis not present

## 2020-07-14 DIAGNOSIS — H6122 Impacted cerumen, left ear: Secondary | ICD-10-CM | POA: Diagnosis not present

## 2020-07-14 DIAGNOSIS — H6983 Other specified disorders of Eustachian tube, bilateral: Secondary | ICD-10-CM | POA: Diagnosis not present

## 2020-07-16 DIAGNOSIS — F064 Anxiety disorder due to known physiological condition: Secondary | ICD-10-CM | POA: Diagnosis not present

## 2020-07-31 DIAGNOSIS — F064 Anxiety disorder due to known physiological condition: Secondary | ICD-10-CM | POA: Diagnosis not present

## 2020-08-20 DIAGNOSIS — F064 Anxiety disorder due to known physiological condition: Secondary | ICD-10-CM | POA: Diagnosis not present

## 2020-09-10 DIAGNOSIS — F064 Anxiety disorder due to known physiological condition: Secondary | ICD-10-CM | POA: Diagnosis not present

## 2020-10-01 DIAGNOSIS — F064 Anxiety disorder due to known physiological condition: Secondary | ICD-10-CM | POA: Diagnosis not present

## 2021-03-03 DIAGNOSIS — J029 Acute pharyngitis, unspecified: Secondary | ICD-10-CM | POA: Diagnosis not present

## 2021-03-11 DIAGNOSIS — R112 Nausea with vomiting, unspecified: Secondary | ICD-10-CM | POA: Diagnosis not present

## 2021-03-11 DIAGNOSIS — J029 Acute pharyngitis, unspecified: Secondary | ICD-10-CM | POA: Diagnosis not present

## 2021-03-17 DIAGNOSIS — R799 Abnormal finding of blood chemistry, unspecified: Secondary | ICD-10-CM | POA: Diagnosis not present

## 2021-03-17 DIAGNOSIS — R7989 Other specified abnormal findings of blood chemistry: Secondary | ICD-10-CM | POA: Diagnosis not present

## 2021-03-26 ENCOUNTER — Other Ambulatory Visit: Payer: Self-pay | Admitting: Family

## 2021-03-26 DIAGNOSIS — D72820 Lymphocytosis (symptomatic): Secondary | ICD-10-CM

## 2021-03-27 ENCOUNTER — Other Ambulatory Visit: Payer: Self-pay

## 2021-03-27 ENCOUNTER — Encounter: Payer: Self-pay | Admitting: Family

## 2021-03-27 ENCOUNTER — Inpatient Hospital Stay (HOSPITAL_BASED_OUTPATIENT_CLINIC_OR_DEPARTMENT_OTHER): Payer: BC Managed Care – PPO | Admitting: Family

## 2021-03-27 ENCOUNTER — Inpatient Hospital Stay: Payer: BC Managed Care – PPO | Attending: Hematology & Oncology

## 2021-03-27 VITALS — BP 117/83 | HR 103 | Temp 98.1°F | Resp 18 | Ht 66.0 in | Wt 216.0 lb

## 2021-03-27 DIAGNOSIS — R0602 Shortness of breath: Secondary | ICD-10-CM | POA: Diagnosis not present

## 2021-03-27 DIAGNOSIS — F1290 Cannabis use, unspecified, uncomplicated: Secondary | ICD-10-CM | POA: Diagnosis not present

## 2021-03-27 DIAGNOSIS — F17290 Nicotine dependence, other tobacco product, uncomplicated: Secondary | ICD-10-CM | POA: Insufficient documentation

## 2021-03-27 DIAGNOSIS — R202 Paresthesia of skin: Secondary | ICD-10-CM | POA: Insufficient documentation

## 2021-03-27 DIAGNOSIS — K219 Gastro-esophageal reflux disease without esophagitis: Secondary | ICD-10-CM | POA: Insufficient documentation

## 2021-03-27 DIAGNOSIS — Z79899 Other long term (current) drug therapy: Secondary | ICD-10-CM | POA: Insufficient documentation

## 2021-03-27 DIAGNOSIS — R5383 Other fatigue: Secondary | ICD-10-CM | POA: Insufficient documentation

## 2021-03-27 DIAGNOSIS — D72820 Lymphocytosis (symptomatic): Secondary | ICD-10-CM | POA: Diagnosis not present

## 2021-03-27 DIAGNOSIS — R61 Generalized hyperhidrosis: Secondary | ICD-10-CM | POA: Diagnosis not present

## 2021-03-27 LAB — CMP (CANCER CENTER ONLY)
ALT: 26 U/L (ref 0–44)
AST: 18 U/L (ref 15–41)
Albumin: 4.4 g/dL (ref 3.5–5.0)
Alkaline Phosphatase: 66 U/L (ref 38–126)
Anion gap: 7 (ref 5–15)
BUN: 13 mg/dL (ref 6–20)
CO2: 27 mmol/L (ref 22–32)
Calcium: 9.3 mg/dL (ref 8.9–10.3)
Chloride: 104 mmol/L (ref 98–111)
Creatinine: 0.78 mg/dL (ref 0.44–1.00)
GFR, Estimated: >60 mL/min (ref >60)
Glucose, Bld: 102 mg/dL — ABNORMAL HIGH (ref 70–99)
Potassium: 4 mmol/L (ref 3.5–5.1)
Sodium: 138 mmol/L (ref 135–145)
Total Bilirubin: 0.3 mg/dL (ref 0.3–1.2)
Total Protein: 7 g/dL (ref 6.5–8.1)

## 2021-03-27 LAB — CBC WITH DIFFERENTIAL (CANCER CENTER ONLY)
Abs Immature Granulocytes: 0.03 10*3/uL (ref 0.00–0.07)
Basophils Absolute: 0.1 10*3/uL (ref 0.0–0.1)
Basophils Relative: 1 %
Eosinophils Absolute: 0.2 10*3/uL (ref 0.0–0.5)
Eosinophils Relative: 2 %
HCT: 39.5 % (ref 36.0–46.0)
Hemoglobin: 13.4 g/dL (ref 12.0–15.0)
Immature Granulocytes: 0 %
Lymphocytes Relative: 40 %
Lymphs Abs: 4.4 10*3/uL — ABNORMAL HIGH (ref 0.7–4.0)
MCH: 31.4 pg (ref 26.0–34.0)
MCHC: 33.9 g/dL (ref 30.0–36.0)
MCV: 92.5 fL (ref 80.0–100.0)
Monocytes Absolute: 0.8 10*3/uL (ref 0.1–1.0)
Monocytes Relative: 7 %
Neutro Abs: 5.7 10*3/uL (ref 1.7–7.7)
Neutrophils Relative %: 50 %
Platelet Count: 363 10*3/uL (ref 150–400)
RBC: 4.27 MIL/uL (ref 3.87–5.11)
RDW: 12.4 % (ref 11.5–15.5)
WBC Count: 11.1 10*3/uL — ABNORMAL HIGH (ref 4.0–10.5)
nRBC: 0 % (ref 0.0–0.2)

## 2021-03-27 LAB — LACTATE DEHYDROGENASE: LDH: 139 U/L (ref 98–192)

## 2021-03-27 LAB — SAVE SMEAR(SSMR), FOR PROVIDER SLIDE REVIEW

## 2021-03-27 NOTE — Progress Notes (Signed)
Hematology/Oncology Consultation  ? ?Name: Paige Nielsen      MRN: [id number]    Location: Room/bed info not found  Date: 03/27/2021 Time:3:10 PM ? ? ?REFERRING PHYSICIAN: Donald Prose, MD ? ?REASON FOR CONSULT:  Lymphocytosis  ?  ?DIAGNOSIS: Lymphocytosis, reactive   ? ?HISTORY OF PRESENT ILLNESS: Paige Nielsen is a very pleasant 41 yo caucasian female with recent lymphocytosis noted on labs with PCP.  ?WBC count today is mildly elevated at 11.1, neutrophils 50% and lymphocytes 40%.  ?She states that her two children recently had strep throat and she likely had as well. Her PCP treated her prophylacticly with amoxacillin.  ?She has had no other issue with infections. Nothing recurrent or persistent.  ?She notes occasional fatigue mild SOB with over exertion. This resolves with taking a moment to rest.  ?She has history of GERD and takes Pepcid which helps.  ?She has occasional chest tight ness that is brief.  ?She does note night sweats at times.  ?No history of diabetes or thyroid disease.  ?No personal or known familial history of cancer.  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  ?She has 2 children and no history of miscarriage.  ?No fever, chills, n/v, cough, rash, dizziness, chest pain, palpitations, abdominal pain or changes in bowel or bladder habits.  ?No swelling, tenderness, numbness or tingling in her extremities at this time.  ?She has occasional tingling in her fingertips but notes that this seems to be better.  ?She quit smoking 1 ppd in 2012. She started vaping in 2020. 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 No ETOH.  ? ?ROS: All other 10 point review of systems is negative.  ? ?PAST MEDICAL HISTORY:   ?Past Medical History:  ?Diagnosis Date  ? Abnormal Pap smear   ? ? ?ALLERGIES: ?Allergies  ?Allergen Reactions  ? Sulfa Antibiotics Other (See Comments)  ?  Unknown childhood rxn  ? ?   ?MEDICATIONS:  ?Current Outpatient Medications on File Prior to Visit  ?Medication  Sig Dispense Refill  ? nystatin (MYCOSTATIN) 100000 UNIT/ML suspension Take 5 mLs (500,000 Units total) by mouth 4 (four) times daily. For 7 to 14 days 60 mL 1  ? prenatal vitamin w/FE, FA (PRENATAL 1 + 1) 27-1 MG TABS Take 1 tablet by mouth daily.      ? ?No current facility-administered medications on file prior to visit.  ? ?  ?PAST SURGICAL HISTORY ?Past Surgical History:  ?Procedure Laterality Date  ? LEEP  2006  ? TONSILLECTOMY    ? WISDOM TOOTH EXTRACTION    ? ? ?FAMILY HISTORY: ?Family History  ?Problem Relation Age of Onset  ? Mental illness Mother   ?     anxiety  ? ? ?SOCIAL HISTORY: ? reports that she quit smoking about 11 years ago. She has never used smokeless tobacco. She reports that she does not drink alcohol and does not use drugs. ? ?PERFORMANCE STATUS: ?The patient's performance status is 0 - Asymptomatic ? ?PHYSICAL EXAM: ?Most Recent Vital Signs: unknown if currently breastfeeding. ?BP 117/83 (BP Location: Right Arm, Patient Position: Sitting)   Pulse (!) 103   Temp 98.1 ?F (36.7 ?C) (Oral)   Resp 18   Ht 5\' 6"  (1.676 m)   Wt 216 lb (98 kg)   SpO2 100%   BMI 34.86 kg/m?  ? ?General Appearance:    Alert, cooperative, no distress, appears stated age  ?Head:    Normocephalic, without obvious abnormality,  atraumatic  ?Eyes:    PERRL, conjunctiva/corneas clear, EOM's intact, fundi  ?  benign, both eyes  ?   ?   ?Throat:   Lips, mucosa, and tongue normal; teeth and gums normal  ?Neck:   Supple, symmetrical, trachea midline, no adenopathy;  ?  thyroid:  no enlargement/tenderness/nodules; no carotid ?  bruit or JVD  ?Back:     Symmetric, no curvature, ROM normal, no CVA tenderness  ?Lungs:     Clear to auscultation bilaterally, respirations unlabored  ?Chest Wall:    No tenderness or deformity  ? Heart:    Regular rate and rhythm, S1 and S2 normal, no murmur, rub   or gallop  ?   ?Abdomen:     Soft, non-tender, bowel sounds active all four quadrants,  ?  no masses, no organomegaly  ?   ?    ?Extremities:   Extremities normal, atraumatic, no cyanosis or edema  ?Pulses:   2+ and symmetric all extremities  ?Skin:   Skin color, texture, turgor normal, no rashes or lesions  ?Lymph nodes:   Cervical, supraclavicular, and axillary nodes normal  ?Neurologic:   CNII-XII intact, normal strength, sensation and reflexes  ?  throughout  ? ? ?LABORATORY DATA:  ?No results found for this or any previous visit (from the past 48 hour(s)).   ? ?RADIOGRAPHY: ?No results found.   ?  ?PATHOLOGY: None ? ?ASSESSMENT/PLAN: Paige Nielsen is a very pleasant 41 yo caucasian female with recent lymphocytosis noted with PCP visit and routine lab work. This is felt to likely be reactive with vape and cannabis use.  ?Blood smear and CBC reviewed with Dr. Marin Olp. No abnormality or evidence of malignancy noted on smear. White blood cells appear to be well developed, mature.   ?At this time we will release her back to her PCP. No follow-up needed.  ? ?All questions were answered. The patient knows to call the clinic with any problems, questions or concerns. We can certainly see her again for any future heme onc issues.  ? ?The patient was discussed with Dr. Marin Olp and he is in agreement with the aforementioned.  ? ?Lottie Dawson, NP ?  ? ? ? ? ? ?

## 2021-03-28 LAB — SURGICAL PATHOLOGY

## 2021-03-31 LAB — FLOW CYTOMETRY

## 2021-04-01 ENCOUNTER — Telehealth: Payer: Self-pay | Admitting: *Deleted

## 2021-04-01 NOTE — Telephone Encounter (Signed)
Per 03/27/21 los - No follow-up needed.  ?

## 2021-04-21 DIAGNOSIS — Z1231 Encounter for screening mammogram for malignant neoplasm of breast: Secondary | ICD-10-CM | POA: Diagnosis not present

## 2021-04-21 DIAGNOSIS — Z6834 Body mass index (BMI) 34.0-34.9, adult: Secondary | ICD-10-CM | POA: Diagnosis not present

## 2021-04-21 DIAGNOSIS — Z124 Encounter for screening for malignant neoplasm of cervix: Secondary | ICD-10-CM | POA: Diagnosis not present

## 2021-04-21 DIAGNOSIS — Z01419 Encounter for gynecological examination (general) (routine) without abnormal findings: Secondary | ICD-10-CM | POA: Diagnosis not present

## 2021-05-19 DIAGNOSIS — F411 Generalized anxiety disorder: Secondary | ICD-10-CM | POA: Diagnosis not present

## 2021-05-19 DIAGNOSIS — F325 Major depressive disorder, single episode, in full remission: Secondary | ICD-10-CM | POA: Diagnosis not present

## 2021-06-04 DIAGNOSIS — Z13220 Encounter for screening for lipoid disorders: Secondary | ICD-10-CM | POA: Diagnosis not present

## 2021-06-04 DIAGNOSIS — Z0000 Encounter for general adult medical examination without abnormal findings: Secondary | ICD-10-CM | POA: Diagnosis not present

## 2021-06-17 DIAGNOSIS — Z3202 Encounter for pregnancy test, result negative: Secondary | ICD-10-CM | POA: Diagnosis not present

## 2021-06-17 DIAGNOSIS — N72 Inflammatory disease of cervix uteri: Secondary | ICD-10-CM | POA: Diagnosis not present

## 2021-06-17 DIAGNOSIS — R87810 Cervical high risk human papillomavirus (HPV) DNA test positive: Secondary | ICD-10-CM | POA: Diagnosis not present

## 2021-06-17 DIAGNOSIS — R87612 Low grade squamous intraepithelial lesion on cytologic smear of cervix (LGSIL): Secondary | ICD-10-CM | POA: Diagnosis not present

## 2021-06-17 DIAGNOSIS — Z6834 Body mass index (BMI) 34.0-34.9, adult: Secondary | ICD-10-CM | POA: Diagnosis not present

## 2023-10-07 ENCOUNTER — Other Ambulatory Visit: Payer: Self-pay

## 2023-10-07 ENCOUNTER — Emergency Department (HOSPITAL_BASED_OUTPATIENT_CLINIC_OR_DEPARTMENT_OTHER)
Admission: EM | Admit: 2023-10-07 | Discharge: 2023-10-07 | Disposition: A | Discharge status: Home / Self Care | Attending: Emergency Medicine | Admitting: Emergency Medicine

## 2023-10-07 DIAGNOSIS — H9202 Otalgia, left ear: Secondary | ICD-10-CM | POA: Diagnosis present

## 2023-10-07 DIAGNOSIS — H6002 Abscess of left external ear: Secondary | ICD-10-CM | POA: Diagnosis not present

## 2023-10-07 MED ORDER — LIDOCAINE HCL (PF) 1 % IJ SOLN
5.0000 mL | Freq: Once | INTRAMUSCULAR | Status: DC
  Filled 2023-10-07: qty 5

## 2023-10-07 MED ORDER — DOXYCYCLINE HYCLATE 100 MG PO CAPS: 100.0000 mg | ORAL_CAPSULE | Freq: Two times a day (BID) | ORAL | 0 refills | Status: AC

## 2023-10-07 NOTE — ED Triage Notes (Signed)
 Small bump behind left ear. Small amount of drainage. Denies fevers.

## 2023-10-07 NOTE — ED Provider Notes (Signed)
 [place] EMERGENCY DEPARTMENT AT [facility] Provider Note   CSN: [id number] Arrival date & time: 10/07/23  1137     Patient presents with: Abscess   Paige Nielsen is a 43 y.o. female.   Patient is a healthy 43 year old female presenting today due to worsening pain and swelling on the back of her left ear.  She reports that there has been a lump there for a long time but in the last few days it has become very tender and swollen.  No systemic symptoms.  The history is provided by the patient.  Abscess      Prior to Admission medications   Medication Sig Start Date End Date Taking? Authorizing Provider  doxycycline  (VIBRAMYCIN ) 100 MG capsule Take 1 capsule (100 mg total) by mouth 2 (two) times daily. 10/07/23  Yes Shanekqua Schaper, Benton, MD  buPROPion (WELLBUTRIN XL) 300 MG 24 hr tablet Take 1 tablet by mouth daily. 03/14/18   [provider]  Cyanocobalamin (VITAMIN B 12) 500 MCG TABS Take 1 tablet by mouth daily.    [provider]  LORazepam (ATIVAN) 1 MG tablet Take 1 mg by mouth daily as needed. 03/26/21   [provider]  Magnesium Citrate 200 MG TABS Take 1 tablet by mouth daily.    [provider]  venlafaxine XR (EFFEXOR-XR) 150 MG 24 hr capsule Take 150 mg by mouth daily. 03/25/21   [provider]    Allergies: Sulfa antibiotics    Review of Systems  Updated Vital Signs BP (!) 144/85 (BP Location: Right Arm)   Pulse (!) 102   Temp 98.7 F (37.1 C) (Oral)   Resp 18   SpO2 99%   Physical Exam Vitals reviewed.  HENT:     Head: Normocephalic.     Left Ear: Tympanic membrane and ear canal normal.     Ears:     Comments: There is an abscess present on the back of the left pinna with some surrounding erythema and swelling.  Fluctuance and pointing noted.  No mastoid tenderness Neurological:     Mental Status: She is alert. Mental status is at baseline.  Psychiatric:        Mood and Affect: Mood normal.      (all labs ordered are listed, but only abnormal results are displayed) Labs Reviewed - No data to display  EKG: None  Radiology: No results found.   Procedures  INCISION AND DRAINAGE Performed by: Benton Shone Consent: Verbal consent obtained. Risks and benefits: risks, benefits and alternatives were discussed Type: abscess  Body area: back of the lobe of the left ear  Anesthesia: local infiltration  Incision was made with a 18g.  Local anesthetic: lidocaine  1% without epinephrine  Anesthetic total: 1 ml  Complexity: simple Blunt dissection to break up loculations  Drainage: purulent  Drainage amount: 3-55mL with sebaceous materials  Packing material: none  Patient tolerance: Patient tolerated the procedure well with no immediate complications.   Medications Ordered in the ED  lidocaine  (PF) (XYLOCAINE ) 1 % injection 5 mL (has no administration in time range)                                    Medical Decision Making Risk Prescription drug management.   Patient presenting here today with an abscess to the lobe of the back of the left ear.  I&D as above with significant pus drainage and  sebaceous material.  Will give patient a prescription for antibiotics to use if her symptoms are not significantly improved tomorrow.  No ear canal or TM involvement.  No findings concerning for mastoiditis.     Final diagnoses:  Abscess of left external ear    ED Discharge Orders          Ordered    doxycycline  (VIBRAMYCIN ) 100 MG capsule  2 times daily        10/07/23 1327               Doretha Folks, MD 10/07/23 1327

## 2023-10-07 NOTE — Discharge Instructions (Addendum)
 You can use warm compresses to the area.  If it is not significantly improved tomorrow you can start the antibiotic.

## 2024-01-10 ENCOUNTER — Telehealth: Admitting: Family Medicine

## 2024-01-10 NOTE — Progress Notes (Signed)
 Pt did not show for visit [facility]
